# Patient Record
Sex: Male | Born: 2000 | Race: White | Hispanic: No | Marital: Single | Smoking: Never smoker
Health system: Southern US, Community
[De-identification: ages and names within clinical notes are randomized; demographics above are authoritative.]

## PROBLEM LIST (undated history)

## (undated) HISTORY — PX: TYMPANOSTOMY TUBE PLACEMENT: SHX32

---
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
## 2018-07-14 ENCOUNTER — Ambulatory Visit (INDEPENDENT_AMBULATORY_CARE_PROVIDER_SITE_OTHER): Payer: BLUE CROSS/BLUE SHIELD | Admitting: Family Medicine

## 2018-07-14 ENCOUNTER — Encounter: Payer: Self-pay | Admitting: Family Medicine

## 2018-07-14 VITALS — Temp 98.7°F | Wt 166.2 lb

## 2018-07-14 DIAGNOSIS — J029 Acute pharyngitis, unspecified: Secondary | ICD-10-CM | POA: Diagnosis not present

## 2018-07-14 DIAGNOSIS — B349 Viral infection, unspecified: Secondary | ICD-10-CM

## 2018-07-14 LAB — POCT RAPID STREP A (OFFICE): RAPID STREP A SCREEN: NEGATIVE

## 2018-07-14 NOTE — Progress Notes (Signed)
Subjective:    Patient ID: Anthony Willis, male    DOB: 20-May-2001, 18 y.o.   MRN: [id number]  Sore Throat   This is a new problem. The current episode started yesterday. There has been no fever. Associated symptoms include headaches. Pertinent negatives include no abdominal pain, congestion, coughing, diarrhea, ear pain, shortness of breath or vomiting. Associated symptoms comments: Runny nose, wasn't able to sleep well last night, fatigue, swollen lymph nodes. He has had exposure to mono. Exposure to: room mate diagnosed with mono last week. He has tried NSAIDs for the symptoms. The treatment provided mild relief.   Pt states that his symptoms are worse in the morning but during the day once he gets up moving, he begins to feel better.   Woke up yesterday morning with sore throat and drainage. Today woke up with h/a and sore throat, did not sleep well last night. Neck is sore today. Feels weak and body aches. Feels better now but still not 100%. Reports a little bit of stomach pain this morning, but not now. States it felt like "hunger pains." No N/V/D.    No cough or congestion. No fevers.   Denies any shared utensils or eating/drinking after roommate.   Review of Systems  Constitutional: Positive for chills. Negative for fever.  HENT: Positive for sore throat. Negative for congestion, ear pain and sinus pressure.   Respiratory: Negative for cough, shortness of breath and wheezing.   Gastrointestinal: Negative for abdominal pain, diarrhea, nausea and vomiting.  Neurological: Positive for headaches.       Objective:   Physical Exam Vitals signs and nursing note reviewed.  Constitutional:      General: He is not in acute distress.    Appearance: Normal appearance. He is not toxic-appearing.  HENT:     Head: Normocephalic and atraumatic.     Right Ear: Tympanic membrane normal.     Left Ear: Tympanic membrane normal.     Nose: Nose normal.     Mouth/Throat:     Mouth: Mucous  membranes are moist.     Pharynx: Posterior oropharyngeal erythema present.  Eyes:     General:        Right eye: No discharge.        Left eye: No discharge.  Neck:     Musculoskeletal: Neck supple. No neck rigidity.  Cardiovascular:     Rate and Rhythm: Normal rate and regular rhythm.     Heart sounds: Normal heart sounds.  Pulmonary:     Effort: Pulmonary effort is normal. No respiratory distress.     Breath sounds: Normal breath sounds.  Abdominal:     General: Bowel sounds are normal. There is no distension.     Palpations: Abdomen is soft. There is no mass.     Tenderness: There is no abdominal tenderness.  Lymphadenopathy:     Cervical: No cervical adenopathy.  Skin:    General: Skin is warm and dry.  Neurological:     Mental Status: He is alert and oriented to person, place, and time.  Psychiatric:        Mood and Affect: Mood normal.    Results for orders placed or performed in visit on 07/14/18  POCT rapid strep A  Result Value Ref Range   Rapid Strep A Screen Negative Negative       Assessment & Plan:  Acute viral syndrome  Sore throat - Plan: POCT rapid strep A  Rapid strep negative today,  will send back up. Discussed with patient that likely this is viral in etiology. Do not feel it is flu or mono at this point. Symptomatic care discussed. Warning signs discussed. F/u if symptoms worsen or fail to improve.   Dr. Lilyan Punt was consulted on this case and is in agreement with the above treatment plan.

## 2018-07-14 NOTE — Patient Instructions (Signed)
Viral Illness, Adult °Viruses are tiny germs that can get into a person's body and cause illness. There are many different types of viruses, and they cause many types of illness. Viral illnesses can range from mild to severe. They can affect various parts of the body. °Common illnesses that are caused by a virus include colds and the flu. Viral illnesses also include serious conditions such as HIV/AIDS (human immunodeficiency virus/acquired immunodeficiency syndrome). A few viruses have been linked to certain cancers. °What are the causes? °Many types of viruses can cause illness. Viruses invade cells in your body, multiply, and cause the infected cells to malfunction or die. When the cell dies, it releases more of the virus. When this happens, you develop symptoms of the illness, and the virus continues to spread to other cells. If the virus takes over the function of the cell, it can cause the cell to divide and grow out of control, as is the case when a virus causes cancer. °Different viruses get into the body in different ways. You can get a virus by: °· Swallowing food or water that is contaminated with the virus. °· Breathing in droplets that have been coughed or sneezed into the air by an infected person. °· Touching a surface that has been contaminated with the virus and then touching your eyes, nose, or mouth. °· Being bitten by an insect or animal that carries the virus. °· Having sexual contact with a person who is infected with the virus. °· Being exposed to blood or fluids that contain the virus, either through an open cut or during a transfusion. °If a virus enters your body, your body's defense system (immune system) will try to fight the virus. You may be at higher risk for a viral illness if your immune system is weak. °What are the signs or symptoms? °Symptoms vary depending on the type of virus and the location of the cells that it invades. Common symptoms of the main types of viral illnesses  include: °Cold and flu viruses °· Fever. °· Headache. °· Sore throat. °· Muscle aches. °· Nasal congestion. °· Cough. °Digestive system (gastrointestinal) viruses °· Fever. °· Abdominal pain. °· Nausea. °· Diarrhea. °Liver viruses (hepatitis) °· Loss of appetite. °· Tiredness. °· Yellowing of the skin (jaundice). °Brain and spinal cord viruses °· Fever. °· Headache. °· Stiff neck. °· Nausea and vomiting. °· Confusion or sleepiness. °Skin viruses °· Warts. °· Itching. °· Rash. °Sexually transmitted viruses °· Discharge. °· Swelling. °· Redness. °· Rash. °How is this treated? °Viruses can be difficult to treat because they live within cells. Antibiotic medicines do not treat viruses because these drugs do not get inside cells. Treatment for a viral illness may include: °· Resting and drinking plenty of fluids. °· Medicines to relieve symptoms. These can include over-the-counter medicine for pain and fever, medicines for cough or congestion, and medicines to relieve diarrhea. °· Antiviral medicines. These drugs are available only for certain types of viruses. They may help reduce flu symptoms if taken early. There are also many antiviral medicines for hepatitis and HIV/AIDS. °Some viral illnesses can be prevented with vaccinations. A common example is the flu shot. °Follow these instructions at home: °Medicines ° °· Take over-the-counter and prescription medicines only as told by your health care provider. °· If you were prescribed an antiviral medicine, take it as told by your health care provider. Do not stop taking the medicine even if you start to feel better. °· Be aware of when   antibiotics are needed and when they are not needed. Antibiotics do not treat viruses. If your health care provider thinks that you may have a bacterial infection as well as a viral infection, you may get an antibiotic. °? Do not ask for an antibiotic prescription if you have been diagnosed with a viral illness. That will not make your  illness go away faster. °? Frequently taking antibiotics when they are not needed can lead to antibiotic resistance. When this develops, the medicine no longer works against the bacteria that it normally fights. °General instructions °· Drink enough fluids to keep your urine clear or pale yellow. °· Rest as much as possible. °· Return to your normal activities as told by your health care provider. Ask your health care provider what activities are safe for you. °· Keep all follow-up visits as told by your health care provider. This is important. °How is this prevented? °Take these actions to reduce your risk of viral infection: °· Eat a healthy diet and get enough rest. °· Wash your hands often with soap and water. This is especially important when you are in public places. If soap and water are not available, use hand sanitizer. °· Avoid close contact with friends and family who have a viral illness. °· If you travel to areas where viral gastrointestinal infection is common, avoid drinking water or eating raw food. °· Keep your immunizations up to date. Get a flu shot every year as told by your health care provider. °· Do not share toothbrushes, nail clippers, razors, or needles with other people. °· Always practice safe sex. ° °Contact a health care provider if: °· You have symptoms of a viral illness that do not go away. °· Your symptoms come back after going away. °· Your symptoms get worse. °Get help right away if: °· You have trouble breathing. °· You have a severe headache or a stiff neck. °· You have severe vomiting or abdominal pain. °This information is not intended to replace advice given to you by your health care provider. Make sure you discuss any questions you have with your health care provider. °Document Released: 10/10/2015 Document Revised: 11/12/2015 Document Reviewed: 10/10/2015 °Elsevier Interactive Patient Education © 2019 [employer] ° °

## 2018-07-15 LAB — STREP A DNA PROBE: Strep Gp A Direct, DNA Probe: NEGATIVE

## 2018-07-15 LAB — SPECIMEN STATUS REPORT

## 2019-01-30 ENCOUNTER — Telehealth: Payer: Self-pay | Admitting: Family Medicine

## 2019-01-30 DIAGNOSIS — M6740 Ganglion, unspecified site: Secondary | ICD-10-CM

## 2019-01-30 NOTE — Telephone Encounter (Signed)
Anthony Willis  I was able to get an appointment for For the patient with Dr. Jeannie Fend [facility] on Tuesday morning 02/06/2019 Appointment time 9:30 AM I did inform the father-Rod via phone [facility] request standard referral information to be sent-I did tell them over the phone that his [name] but please go ahead and send the information thank you  His referral was because of a ganglion cyst on the right side

## 2019-01-30 NOTE — Telephone Encounter (Signed)
Patient has ganglion cyst on the right wrist Please give referral to [facility]

## 2019-01-30 NOTE — Addendum Note (Signed)
Addended by: Carmelina Noun on: 01/30/2019 01:59 PM   Modules accepted: Orders

## 2019-01-30 NOTE — Telephone Encounter (Signed)
Referral put in.

## 2019-02-02 DIAGNOSIS — Z03818 Encounter for observation for suspected exposure to other biological agents ruled out: Secondary | ICD-10-CM | POA: Diagnosis not present

## 2019-02-06 DIAGNOSIS — M67431 Ganglion, right wrist: Secondary | ICD-10-CM | POA: Diagnosis not present

## 2019-02-06 DIAGNOSIS — M25531 Pain in right wrist: Secondary | ICD-10-CM | POA: Diagnosis not present

## 2019-02-26 DIAGNOSIS — Z03818 Encounter for observation for suspected exposure to other biological agents ruled out: Secondary | ICD-10-CM | POA: Diagnosis not present

## 2019-02-26 DIAGNOSIS — U071 COVID-19: Secondary | ICD-10-CM | POA: Diagnosis not present

## 2022-06-30 ENCOUNTER — Encounter (HOSPITAL_BASED_OUTPATIENT_CLINIC_OR_DEPARTMENT_OTHER): Payer: Self-pay

## 2022-06-30 ENCOUNTER — Emergency Department (HOSPITAL_BASED_OUTPATIENT_CLINIC_OR_DEPARTMENT_OTHER)
Admission: EM | Admit: 2022-06-30 | Discharge: 2022-06-30 | Disposition: A | Discharge status: Home / Self Care | Payer: BC Managed Care – PPO | Attending: Emergency Medicine | Admitting: Emergency Medicine

## 2022-06-30 ENCOUNTER — Other Ambulatory Visit: Payer: Self-pay

## 2022-06-30 DIAGNOSIS — S71152A Open bite, left thigh, initial encounter: Secondary | ICD-10-CM | POA: Diagnosis not present

## 2022-06-30 DIAGNOSIS — W540XXA Bitten by dog, initial encounter: Secondary | ICD-10-CM | POA: Insufficient documentation

## 2022-06-30 DIAGNOSIS — Z23 Encounter for immunization: Secondary | ICD-10-CM | POA: Insufficient documentation

## 2022-06-30 DIAGNOSIS — S81852A Open bite, left lower leg, initial encounter: Secondary | ICD-10-CM | POA: Diagnosis not present

## 2022-06-30 MED ORDER — AMOXICILLIN-POT CLAVULANATE 875-125 MG PO TABS: 1.0000 | ORAL_TABLET | Freq: Two times a day (BID) | ORAL | 0 refills | Status: DC

## 2022-06-30 MED ORDER — RABIES IMMUNE GLOBULIN 150 UNIT/ML IM INJ
20.0000 [IU]/kg | INJECTION | Freq: Once | INTRAMUSCULAR | Status: AC
  Administered 2022-06-30: 1500 [IU]
  Filled 2022-06-30: qty 10

## 2022-06-30 MED ORDER — RABIES VACCINE, PCEC IM SUSR
1.0000 mL | Freq: Once | INTRAMUSCULAR | Status: AC
  Administered 2022-06-30: 1 mL via INTRAMUSCULAR
  Filled 2022-06-30: qty 1

## 2022-06-30 MED ORDER — AMOXICILLIN-POT CLAVULANATE 875-125 MG PO TABS
1.0000 | ORAL_TABLET | Freq: Once | ORAL | Status: AC
  Administered 2022-06-30: 1 via ORAL
  Filled 2022-06-30: qty 1

## 2022-06-30 NOTE — ED Provider Notes (Signed)
[facility] EMERGENCY DEPT Provider Note   CSN: [id number] Arrival date & time: 06/30/22  1947     History  Chief Complaint  Patient presents with   Animal Bite    Anthony Willis is a 22 y.o. male with no documented medical history.  The patient presents to the ED for evaluation of dog bite.  Patient reports that prior to arrival he was on a run in the [facility] area when a dog that he believes was a pit bull reached out and bit him on his posterior left thigh.  Patient unsure of vaccination status of dog.  The patient does not know the dog's owner.   Animal Bite      Home Medications Prior to Admission medications   Medication Sig Start Date End Date Taking? Authorizing Provider  amoxicillin-clavulanate (AUGMENTIN) 875-125 MG tablet Take 1 tablet by mouth every 12 (twelve) hours. 06/30/22  Yes Azucena Cecil, PA-C      Allergies    Patient has no known allergies.    Review of Systems   Review of Systems  Skin:  Positive for wound.  All other systems reviewed and are negative.   Physical Exam Updated Vital Signs BP (!) 150/75   Pulse 94   Temp 98.9 F (37.2 C) (Oral)   Resp 17   Ht 5\' 8"  (1.727 m)   Wt 74.4 kg   SpO2 100%   BMI 24.94 kg/m  Physical Exam Vitals and nursing note reviewed.  Constitutional:      General: He is not in acute distress.    Appearance: He is well-developed.  HENT:     Head: Normocephalic and atraumatic.     Mouth/Throat:     Mouth: Mucous membranes are moist.     Pharynx: Oropharynx is clear.  Eyes:     Conjunctiva/sclera: Conjunctivae normal.  Cardiovascular:     Rate and Rhythm: Normal rate and regular rhythm.     Heart sounds: No murmur heard. Pulmonary:     Effort: Pulmonary effort is normal. No respiratory distress.     Breath sounds: Normal breath sounds.  Abdominal:     Palpations: Abdomen is soft.     Tenderness: There is no abdominal tenderness.  Musculoskeletal:        General: No swelling.      Cervical back: Neck supple.  Skin:    General: Skin is warm and dry.     Capillary Refill: Capillary refill takes less than 2 seconds.  Neurological:     Mental Status: He is alert.  Psychiatric:        Mood and Affect: Mood normal.     ED Results / Procedures / Treatments   Labs (all labs ordered are listed, but only abnormal results are displayed) Labs Reviewed - No data to display  EKG None  Radiology No results found.  Procedures Procedures    Medications Ordered in ED Medications  rabies immune globulin (HYPERRAB/KEDRAB) injection 1,500 Units (1,500 Units Infiltration Given 06/30/22 2230)  rabies vaccine (RABAVERT) injection 1 mL (1 mL Intramuscular Given 06/30/22 2226)  amoxicillin-clavulanate (AUGMENTIN) 875-125 MG per tablet 1 tablet (1 tablet Oral Given 06/30/22 2221)    ED Course/ Medical Decision Making/ A&P                          Medical Decision Making Risk Prescription drug management.   22 year old male presents to ED for evaluation of dog bite.  Please  see HPI for further details.  On examination patient does have wound consistent with dog bite to posterior left thigh.  No bleeding.  Patient reports he is up-to-date on tetanus vaccination.  Patient unsure of vaccination status of dog.  Patient does not know the dog's owner.  We will proceed with 1 mL of rabies revaccination along with immunoglobulin.  Patient will be provided with his first dose of Augmentin here in the department.  Patient advised to return in 3 days for second vaccination series.  Patient encouraged to attempt to find out vaccination status of dog in the meantime.  Patient given return precautions and he voiced understanding.  Patient had all of his questions answered to his satisfaction.  The patient is stable at this time for discharge home.   Final Clinical Impression(s) / ED Diagnoses Final diagnoses:  Dog bite, initial encounter    Rx / DC Orders ED Discharge Orders           Ordered    amoxicillin-clavulanate (AUGMENTIN) 875-125 MG tablet  Every 12 hours        06/30/22 2244              Azucena Cecil, PA-C 06/30/22 2245    Long, [facility], MD 07/08/22 [phone]

## 2022-06-30 NOTE — Discharge Instructions (Signed)
Please return to the ED on 1/20 for second vaccination Please read attached guide concerning animal bites Please take Augmentin antibiotics to completion

## 2022-06-30 NOTE — ED Triage Notes (Addendum)
Patient reports being bit by a dog at 1630pm on the back of the left leg. Wound was cleaned at home with peroxide. Patient attempted to call animal control, but no one arrived. Patient reports dog was a 70lb pitbull. Patient did not know the owner of the dog.

## 2022-07-03 ENCOUNTER — Encounter (HOSPITAL_BASED_OUTPATIENT_CLINIC_OR_DEPARTMENT_OTHER): Payer: Self-pay

## 2022-07-03 ENCOUNTER — Emergency Department (HOSPITAL_BASED_OUTPATIENT_CLINIC_OR_DEPARTMENT_OTHER)
Admission: EM | Admit: 2022-07-03 | Discharge: 2022-07-03 | Disposition: A | Discharge status: Home / Self Care | Payer: BC Managed Care – PPO | Attending: Emergency Medicine | Admitting: Emergency Medicine

## 2022-07-03 DIAGNOSIS — Z203 Contact with and (suspected) exposure to rabies: Secondary | ICD-10-CM

## 2022-07-03 DIAGNOSIS — Z23 Encounter for immunization: Secondary | ICD-10-CM | POA: Insufficient documentation

## 2022-07-03 DIAGNOSIS — Z2914 Encounter for prophylactic rabies immune globin: Secondary | ICD-10-CM | POA: Diagnosis not present

## 2022-07-03 MED ORDER — RABIES VACCINE, PCEC IM SUSR
1.0000 mL | Freq: Once | INTRAMUSCULAR | Status: AC
  Administered 2022-07-03: 1 mL via INTRAMUSCULAR
  Filled 2022-07-03: qty 1

## 2022-07-03 NOTE — ED Provider Notes (Signed)
  [facility] Provider Note   CSN: [id number] Arrival date & time: 07/03/22  9767     History  Chief Complaint  Patient presents with   2nd Rabies vaccine    Anthony Willis is a 22 y.o. male.  Patient is here for second rabies vaccine.  He was seen 3 days ago after he was bitten on the back of his left thigh with from an unknown dog.  He is here for his second injection.  He previously got his first rabies vaccine as well as immunoglobulin.  He says overall the wound is healing well.  No increased pain or drainage.       Home Medications Prior to Admission medications   Medication Sig Start Date End Date Taking? Authorizing Provider  amoxicillin-clavulanate (AUGMENTIN) 875-125 MG tablet Take 1 tablet by mouth every 12 (twelve) hours. 06/30/22   Azucena Cecil, PA-C      Allergies    Patient has no known allergies.    Review of Systems   Review of Systems  Constitutional:  Negative for fever.  Gastrointestinal:  Negative for nausea and vomiting.  Skin:  Positive for wound.    Physical Exam Updated Vital Signs BP 119/79 (BP Location: Right Arm)   Pulse 60   Temp 98.2 F (36.8 C) (Oral)   Resp 15   Ht 5\' 8"  (1.727 m)   Wt 74.4 kg   SpO2 99%   BMI 24.94 kg/m  Physical Exam Constitutional:      Appearance: He is well-developed.  HENT:     Head: Normocephalic and atraumatic.  Cardiovascular:     Rate and Rhythm: Normal rate.  Pulmonary:     Effort: Pulmonary effort is normal.  Musculoskeletal:        General: No tenderness.     Cervical back: Normal range of motion and neck supple.     Comments: Healing wounds to the posterior aspect of the left thigh.  No signs of infection.  There are some ecchymosis around the wounds.  No drainage.  Skin:    General: Skin is warm and dry.  Neurological:     Mental Status: He is alert and oriented to person, place, and time.     ED Results / Procedures / Treatments    Labs (all labs ordered are listed, but only abnormal results are displayed) Labs Reviewed - No data to display  EKG None  Radiology No results found.  Procedures Procedures    Medications Ordered in ED Medications  rabies vaccine (RABAVERT) injection 1 mL (has no administration in time range)    ED Course/ Medical Decision Making/ A&P                             Medical Decision Making Risk Prescription drug management.   Patient presents for his second rabies vaccine.  Overall the wound appears to be healing well.  He was given his rabies vaccine.  He was advised to either return here or go to the [facility] health urgent care for his third and fourth vaccine in the series.  Return precautions were given.  Final Clinical Impression(s) / ED Diagnoses Final diagnoses:  Rabies exposure    Rx / DC Orders ED Discharge Orders     None         Malvin Johns, MD 07/03/22 [phone]

## 2022-07-03 NOTE — Discharge Instructions (Signed)
Follow-up for your repeat rabies vaccines January 24 and January 31.  You can either return here or follow-up at the [facility] health urgent care.  Return to emergency room if you have any worsening symptoms.

## 2022-07-03 NOTE — ED Notes (Signed)
Dc instructions reviewed with patient. Patient voiced understanding. Dc with belongings.  °

## 2022-07-03 NOTE — ED Triage Notes (Signed)
He states he is here for 2nd rabies vaccine. The wounds on his post. Left thigh are clean and healing, with some ecchymosis present. The wounds are without purulence or drainage of any kind.

## 2022-07-07 ENCOUNTER — Other Ambulatory Visit: Payer: Self-pay

## 2022-07-07 ENCOUNTER — Encounter: Payer: Self-pay | Admitting: Emergency Medicine

## 2022-07-07 ENCOUNTER — Ambulatory Visit
Admission: EM | Admit: 2022-07-07 | Discharge: 2022-07-07 | Disposition: A | Discharge status: Home / Self Care | Payer: BC Managed Care – PPO | Attending: Family Medicine | Admitting: Family Medicine

## 2022-07-07 DIAGNOSIS — Z203 Contact with and (suspected) exposure to rabies: Secondary | ICD-10-CM | POA: Diagnosis not present

## 2022-07-07 MED ORDER — RABIES VACCINE, PCEC IM SUSR
1.0000 mL | Freq: Once | INTRAMUSCULAR | Status: AC
  Administered 2022-07-07: 1 mL via INTRAMUSCULAR

## 2022-07-07 NOTE — ED Triage Notes (Signed)
Pt reports for 3rd rabies vaccine.

## 2022-07-14 ENCOUNTER — Ambulatory Visit
Admission: EM | Admit: 2022-07-14 | Discharge: 2022-07-14 | Disposition: A | Discharge status: Home / Self Care | Payer: BC Managed Care – PPO | Attending: Family Medicine | Admitting: Family Medicine

## 2022-07-14 DIAGNOSIS — Z23 Encounter for immunization: Secondary | ICD-10-CM

## 2022-07-14 MED ORDER — RABIES VACCINE, PCEC IM SUSR
1.0000 mL | Freq: Once | INTRAMUSCULAR | Status: AC
  Administered 2022-07-14: 1 mL via INTRAMUSCULAR

## 2022-07-14 NOTE — ED Triage Notes (Signed)
Pt presents for Rabies vaccines #4.

## 2024-05-02 ENCOUNTER — Encounter: Payer: Self-pay | Admitting: Family Medicine

## 2024-05-02 ENCOUNTER — Ambulatory Visit (INDEPENDENT_AMBULATORY_CARE_PROVIDER_SITE_OTHER): Admitting: Family Medicine

## 2024-05-02 VITALS — BP 118/66 | HR 86 | Ht 68.0 in | Wt 175.0 lb

## 2024-05-02 DIAGNOSIS — Z13220 Encounter for screening for lipoid disorders: Secondary | ICD-10-CM

## 2024-05-02 DIAGNOSIS — Z0000 Encounter for general adult medical examination without abnormal findings: Secondary | ICD-10-CM | POA: Diagnosis not present
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
